# Patient Record
Sex: Female | Born: 1960 | Race: White | Hispanic: No | Marital: Single | Smoking: Current every day smoker
Health system: Southern US, Community
[De-identification: ages and names within clinical notes are randomized; demographics above are authoritative.]

## PROBLEM LIST (undated history)

## (undated) HISTORY — PX: OTHER SURGICAL HISTORY: SHX169

---

## 2001-04-09 ENCOUNTER — Emergency Department (HOSPITAL_COMMUNITY): Admission: EM | Admit: 2001-04-09 | Discharge: 2001-04-09 | Payer: Self-pay | Admitting: *Deleted

## 2001-09-08 ENCOUNTER — Emergency Department (HOSPITAL_COMMUNITY): Admission: EM | Admit: 2001-09-08 | Discharge: 2001-09-08 | Payer: Self-pay | Admitting: Emergency Medicine

## 2002-01-18 ENCOUNTER — Emergency Department (HOSPITAL_COMMUNITY): Admission: EM | Admit: 2002-01-18 | Discharge: 2002-01-18 | Payer: Self-pay | Admitting: Emergency Medicine

## 2002-01-26 ENCOUNTER — Emergency Department (HOSPITAL_COMMUNITY): Admission: EM | Admit: 2002-01-26 | Discharge: 2002-01-26 | Payer: Self-pay | Admitting: Emergency Medicine

## 2002-07-15 ENCOUNTER — Emergency Department (HOSPITAL_COMMUNITY): Admission: EM | Admit: 2002-07-15 | Discharge: 2002-07-15 | Payer: Self-pay | Admitting: Emergency Medicine

## 2007-06-10 ENCOUNTER — Emergency Department (HOSPITAL_COMMUNITY): Admission: EM | Admit: 2007-06-10 | Discharge: 2007-06-10 | Payer: Self-pay | Admitting: Family Medicine

## 2007-11-12 ENCOUNTER — Emergency Department (HOSPITAL_COMMUNITY): Admission: EM | Admit: 2007-11-12 | Discharge: 2007-11-12 | Payer: Self-pay | Admitting: Family Medicine

## 2008-08-14 ENCOUNTER — Emergency Department (HOSPITAL_COMMUNITY): Admission: EM | Admit: 2008-08-14 | Discharge: 2008-08-14 | Payer: Self-pay | Admitting: Emergency Medicine

## 2009-06-12 ENCOUNTER — Emergency Department (HOSPITAL_COMMUNITY): Admission: EM | Admit: 2009-06-12 | Discharge: 2009-06-12 | Payer: Self-pay | Admitting: Emergency Medicine

## 2010-08-17 LAB — RHEUMATOID FACTOR: Rheumatoid fact SerPl-aCnc: <20 IU/mL (ref 0–20)

## 2010-08-17 LAB — ANTISTREPTOLYSIN O TITER: ASO: 32 IU/mL (ref 0–116)

## 2010-08-17 LAB — SEDIMENTATION RATE: Sed Rate: 3 mm/hr (ref 0–22)

## 2012-05-14 ENCOUNTER — Emergency Department (HOSPITAL_COMMUNITY): Payer: Worker's Compensation

## 2012-05-14 ENCOUNTER — Emergency Department (HOSPITAL_COMMUNITY)
Admission: EM | Admit: 2012-05-14 | Discharge: 2012-05-14 | Disposition: A | Discharge status: Home / Self Care | Payer: Worker's Compensation | Attending: Emergency Medicine | Admitting: Emergency Medicine

## 2012-05-14 ENCOUNTER — Encounter (HOSPITAL_COMMUNITY): Payer: Self-pay | Admitting: Emergency Medicine

## 2012-05-14 DIAGNOSIS — S0003XA Contusion of scalp, initial encounter: Secondary | ICD-10-CM | POA: Insufficient documentation

## 2012-05-14 DIAGNOSIS — F17200 Nicotine dependence, unspecified, uncomplicated: Secondary | ICD-10-CM | POA: Insufficient documentation

## 2012-05-14 DIAGNOSIS — IMO0002 Reserved for concepts with insufficient information to code with codable children: Secondary | ICD-10-CM | POA: Insufficient documentation

## 2012-05-14 DIAGNOSIS — S50311A Abrasion of right elbow, initial encounter: Secondary | ICD-10-CM

## 2012-05-14 MED ORDER — IBUPROFEN 600 MG PO TABS: 600.0000 mg | ORAL_TABLET | Freq: Four times a day (QID) | ORAL | Status: DC | PRN

## 2012-05-14 MED ORDER — OXYCODONE-ACETAMINOPHEN 5-325 MG PO TABS
1.0000 | ORAL_TABLET | Freq: Once | ORAL | Status: AC
  Administered 2012-05-14: 1 via ORAL
  Filled 2012-05-14: qty 1
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
## 2012-05-14 NOTE — ED Notes (Signed)
The pt is asking  Again when she will be seen.
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
## 2012-05-14 NOTE — ED Notes (Signed)
Patient transported to X-ray and CT 

## 2012-05-14 NOTE — ED Provider Notes (Signed)
Medical screening examination/treatment/procedure(s) were performed by non-physician practitioner and as supervising physician I was immediately available for consultation/collaboration.  Gerhard Munch, MD 05/14/12 2355

## 2012-05-14 NOTE — ED Notes (Signed)
Pt requesting pain med.   Triage rn   notified

## 2012-07-03 ENCOUNTER — Emergency Department (INDEPENDENT_AMBULATORY_CARE_PROVIDER_SITE_OTHER)
Admission: EM | Admit: 2012-07-03 | Discharge: 2012-07-03 | Disposition: A | Discharge status: Home / Self Care | Payer: Self-pay | Source: Home / Self Care | Attending: Family Medicine | Admitting: Family Medicine

## 2012-07-03 ENCOUNTER — Encounter (HOSPITAL_COMMUNITY): Payer: Self-pay | Admitting: Emergency Medicine

## 2012-07-03 DIAGNOSIS — K047 Periapical abscess without sinus: Secondary | ICD-10-CM

## 2012-07-03 MED ORDER — AMOXICILLIN 500 MG PO CAPS: 500.0000 mg | ORAL_CAPSULE | Freq: Three times a day (TID) | ORAL | Status: DC

## 2012-07-03 MED ORDER — DICLOFENAC POTASSIUM 50 MG PO TABS: 50.0000 mg | ORAL_TABLET | Freq: Three times a day (TID) | ORAL | Status: DC

## 2012-07-03 NOTE — ED Notes (Signed)
Pt c/o dental pain x2 days Sx include: swelling, pain Denies: f/v/n/d Had ibuprofen today around 1430  She is alert w/no signs of acute distress.

## 2012-07-03 NOTE — ED Provider Notes (Signed)
History     CSN: [id number]  Arrival date & time 07/03/12  1758   First MD Initiated Contact with Patient 07/03/12 1811      Chief Complaint  Patient presents with  . Dental Pain    (Consider location/radiation/quality/duration/timing/severity/associated sxs/prior treatment) Patient is a 52 y.o. female presenting with tooth pain. The history is provided by the patient.  Dental PainThe primary symptoms include mouth pain and oral lesions. The symptoms began 2 days ago. The symptoms are worsening.  Additional symptoms include: gum tenderness, purulent gums and jaw pain.    History reviewed. No pertinent past medical history.  History reviewed. No pertinent past surgical history.  No family history on file.  History  Substance Use Topics  . Smoking status: Current Every Day Smoker  . Smokeless tobacco: Not on file  . Alcohol Use: Yes    OB History   Grav Para Term Preterm Abortions TAB SAB Ect Mult Living                  Review of Systems  Constitutional: Negative.   HENT: Positive for dental problem.     Allergies  Review of patient's allergies indicates no known allergies.  Home Medications   Current Outpatient Rx  Name  Route  Sig  Dispense  Refill  . ibuprofen (ADVIL,MOTRIN) 600 MG tablet   Oral   Take 1 tablet (600 mg total) by mouth every 6 (six) hours as needed for pain.   30 tablet   0   . amoxicillin (AMOXIL) 500 MG capsule   Oral   Take 1 capsule (500 mg total) by mouth 3 (three) times daily.   30 capsule   0   . diclofenac (CATAFLAM) 50 MG tablet   Oral   Take 1 tablet (50 mg total) by mouth 3 (three) times daily.   21 tablet   0     BP 169/95  Pulse 71  Temp(Src) 99.1 F (37.3 C) (Oral)  Resp 18  SpO2 99%  Physical Exam  Nursing note and vitals reviewed. Constitutional: She appears well-developed and well-nourished.  HENT:  Right Ear: External ear normal.  Left Ear: External ear normal.  Mouth/Throat: Uvula is midline and  oropharynx is clear and moist. Abnormal dentition. Dental abscesses and dental caries present.      ED Course  Procedures (including critical care time)  Labs Reviewed - No data to display No results found.   1. Dental abscess       MDM          Linna Hoff, MD 07/03/12 1911

## 2012-08-04 ENCOUNTER — Emergency Department (HOSPITAL_COMMUNITY)
Admission: EM | Admit: 2012-08-04 | Discharge: 2012-08-04 | Disposition: A | Discharge status: Home / Self Care | Payer: Self-pay | Attending: Emergency Medicine | Admitting: Emergency Medicine

## 2012-08-04 ENCOUNTER — Encounter (HOSPITAL_COMMUNITY): Payer: Self-pay | Admitting: *Deleted

## 2012-08-04 DIAGNOSIS — K047 Periapical abscess without sinus: Secondary | ICD-10-CM | POA: Insufficient documentation

## 2012-08-04 DIAGNOSIS — K029 Dental caries, unspecified: Secondary | ICD-10-CM | POA: Insufficient documentation

## 2012-08-04 DIAGNOSIS — K089 Disorder of teeth and supporting structures, unspecified: Secondary | ICD-10-CM | POA: Insufficient documentation

## 2012-08-04 DIAGNOSIS — F17200 Nicotine dependence, unspecified, uncomplicated: Secondary | ICD-10-CM | POA: Insufficient documentation

## 2012-08-04 DIAGNOSIS — K0889 Other specified disorders of teeth and supporting structures: Secondary | ICD-10-CM

## 2012-08-04 MED ORDER — METRONIDAZOLE 500 MG PO TABS
500.0000 mg | ORAL_TABLET | Freq: Once | ORAL | Status: AC
  Administered 2012-08-04: 500 mg via ORAL
  Filled 2012-08-04: qty 1

## 2012-08-04 MED ORDER — OXYCODONE-ACETAMINOPHEN 5-325 MG PO TABS
1.0000 | ORAL_TABLET | Freq: Once | ORAL | Status: AC
  Administered 2012-08-04: 1 via ORAL
  Filled 2012-08-04: qty 1

## 2012-08-04 MED ORDER — PENICILLIN V POTASSIUM 500 MG PO TABS: 500.0000 mg | ORAL_TABLET | Freq: Four times a day (QID) | ORAL | Status: DC

## 2012-08-04 MED ORDER — PENICILLIN V POTASSIUM 250 MG PO TABS
500.0000 mg | ORAL_TABLET | Freq: Once | ORAL | Status: AC
  Administered 2012-08-04: 500 mg via ORAL
  Filled 2012-08-04: qty 2

## 2012-08-04 MED ORDER — OXYCODONE-ACETAMINOPHEN 5-325 MG PO TABS: ORAL_TABLET | ORAL | Status: DC

## 2012-08-04 MED ORDER — METRONIDAZOLE 500 MG PO TABS: 500.0000 mg | ORAL_TABLET | Freq: Two times a day (BID) | ORAL | Status: DC

## 2012-08-04 NOTE — ED Provider Notes (Signed)
History     CSN: [id number]  Arrival date & time 08/04/12  0918   First MD Initiated Contact with Patient 08/04/12 0945      Chief Complaint  Patient presents with  . Dental Pain    (Consider location/radiation/quality/duration/timing/severity/associated sxs/prior treatment) HPI  Laurie Marks is a 52 y.o. female complaining of right lower tooth pain exacerbation and swelling onset last night. Patient denies difficulty swallowing her secretions, fever, vomiting. Pain is rated as severe, 8/10 she is taken Motrin 800 mg with little relief. Pain is exacerbated by chewing. Patient has an appointment with a dentist for extraction of the affected tooth on Wednesday.   History reviewed. No pertinent past medical history.  Past Surgical History  Procedure Laterality Date  . Tracheotomy      as child    No family history on file.  History  Substance Use Topics  . Smoking status: Current Every Day Smoker  . Smokeless tobacco: Not on file  . Alcohol Use: Yes    OB History   Grav Para Term Preterm Abortions TAB SAB Ect Mult Living                  Review of Systems  Constitutional: Negative for fever.  HENT: Positive for dental problem. Negative for ear pain, facial swelling, drooling, trouble swallowing, neck pain, neck stiffness and voice change.   Respiratory: Negative for shortness of breath.   Cardiovascular: Negative for chest pain.  Gastrointestinal: Negative for nausea, vomiting, abdominal pain and diarrhea.  All other systems reviewed and are negative.    Allergies  Review of patient's allergies indicates no known allergies.  Home Medications   Current Outpatient Rx  Name  Route  Sig  Dispense  Refill  . ibuprofen (ADVIL,MOTRIN) 200 MG tablet   Oral   Take 800 mg by mouth every 6 (six) hours as needed for pain.           BP 144/83  Pulse 73  Temp(Src) 98.5 F (36.9 C) (Oral)  Resp 18  SpO2 98%  Physical Exam  Nursing note and vitals  reviewed. Constitutional: She is oriented to person, place, and time. She appears well-developed and well-nourished. No distress.  HENT:  Head: Normocephalic.  Mouth/Throat: Oropharynx is clear and moist.    Mild swelling to right jaw area with no erythema or induration externally.  Extensive significantly eroded dental caries. 2:30 actively painful with lateral oral mucosa swelling, erythema and tenderness.  No firmness or tenderness to palpation under,  Eyes: Conjunctivae and EOM are normal.  Cardiovascular: Normal rate.   Pulmonary/Chest: Effort normal. No stridor.  Musculoskeletal: Normal range of motion.  Lymphadenopathy:    She has cervical adenopathy.  Neurological: She is alert and oriented to person, place, and time.  Psychiatric: She has a normal mood and affect.    ED Course  Procedures (including critical care time)  Labs Reviewed - No data to display No results found.  Medications  oxyCODONE-acetaminophen (PERCOCET/ROXICET) 5-325 MG per tablet 1 tablet (not administered)  penicillin v potassium (VEETID) tablet 500 mg (not administered)  metroNIDAZOLE (FLAGYL) tablet 500 mg (not administered)    1. Pain, dental   2. Dental abscess   3. Tobacco use disorder       MDM    Laurie Marks is a 52 y.o. female with right lower dental caries and dental abscess.   Filed Vitals:   08/04/12 0923  BP: 144/83  Pulse: 73  Temp: 98.5 F (  36.9 C)  TempSrc: Oral  Resp: 18  SpO2: 98%     Pt verbalized understanding and agrees with care plan. Outpatient follow-up and return precautions given.    New Prescriptions   METRONIDAZOLE (FLAGYL) 500 MG TABLET    Take 1 tablet (500 mg total) by mouth 2 (two) times daily. One tab PO bid x 10 days   OXYCODONE-ACETAMINOPHEN (PERCOCET/ROXICET) 5-325 MG PER TABLET    1 to 2 tabs PO q6hrs  PRN for pain   PENICILLIN V POTASSIUM (VEETID) 500 MG TABLET    Take 1 tablet (500 mg total) by mouth 4 (four) times daily.            Wynetta Emery, PA-C 08/04/12 1030

## 2012-08-04 NOTE — ED Notes (Signed)
Pt is here with right lower jaw swelling and tooth pain.

## 2012-08-05 NOTE — ED Provider Notes (Signed)
Medical screening examination/treatment/procedure(s) were performed by non-physician practitioner and as supervising physician I was immediately available for consultation/collaboration.  Flint Melter, MD 08/05/12 [phone]

## 2012-09-23 ENCOUNTER — Emergency Department (HOSPITAL_COMMUNITY)
Admission: EM | Admit: 2012-09-23 | Discharge: 2012-09-23 | Disposition: A | Discharge status: Home / Self Care | Payer: Self-pay | Attending: Emergency Medicine | Admitting: Emergency Medicine

## 2012-09-23 ENCOUNTER — Encounter (HOSPITAL_COMMUNITY): Payer: Self-pay | Admitting: Emergency Medicine

## 2012-09-23 DIAGNOSIS — F17200 Nicotine dependence, unspecified, uncomplicated: Secondary | ICD-10-CM | POA: Insufficient documentation

## 2012-09-23 DIAGNOSIS — K0889 Other specified disorders of teeth and supporting structures: Secondary | ICD-10-CM

## 2012-09-23 DIAGNOSIS — K089 Disorder of teeth and supporting structures, unspecified: Secondary | ICD-10-CM | POA: Insufficient documentation

## 2012-09-23 MED ORDER — CLINDAMYCIN HCL 150 MG PO CAPS: 300.0000 mg | ORAL_CAPSULE | Freq: Three times a day (TID) | ORAL | Status: AC

## 2012-09-23 MED ORDER — HYDROCODONE-ACETAMINOPHEN 5-325 MG PO TABS
2.0000 | ORAL_TABLET | Freq: Four times a day (QID) | ORAL | Status: AC | PRN
Start: 2012-09-23 — End: ?

## 2012-09-23 NOTE — ED Provider Notes (Signed)
History     CSN: [id number]  Arrival date & time 09/23/12  4098   First MD Initiated Contact with Patient 09/23/12 0900      No chief complaint on file.   (Consider location/radiation/quality/duration/timing/severity/associated sxs/prior treatment) HPI Comments: Patient presents to the emergency department with a dental complaint. Symptoms began one month ago. The patient has tried to alleviate pain with nothing.  Pain rated at a 10/10, characterized as throbbing in nature and located bottom right jaw. Patient denies fever, night sweats, chills, difficulty swallowing or opening mouth, SOB, nuchal rigidity or decreased ROM of neck.  Patient does not have a dentist and requests a resource guide at discharge.   The history is provided by the patient. No language interpreter was used.    History reviewed. No pertinent past medical history.  Past Surgical History  Procedure Laterality Date  . Tracheotomy      as child    No family history on file.  History  Substance Use Topics  . Smoking status: Current Every Day Smoker  . Smokeless tobacco: Not on file  . Alcohol Use: Yes    OB History   Grav Para Term Preterm Abortions TAB SAB Ect Mult Living                  Review of Systems  All other systems reviewed and are negative.    Allergies  Review of patient's allergies indicates no known allergies.  Home Medications   Current Outpatient Rx  Name  Route  Sig  Dispense  Refill  . ibuprofen (ADVIL,MOTRIN) 200 MG tablet   Oral   Take 800 mg by mouth every 6 (six) hours as needed for pain.         . clindamycin (CLEOCIN) 150 MG capsule   Oral   Take 2 capsules (300 mg total) by mouth 3 (three) times daily. May dispense as 150mg  capsules   60 capsule   0   . HYDROcodone-acetaminophen (NORCO/VICODIN) 5-325 MG per tablet   Oral   Take 2 tablets by mouth every 6 (six) hours as needed for pain.   13 tablet   0     BP 176/90  Pulse 81  Temp(Src) 98.7 F  (37.1 C) (Oral)  SpO2 98%  Physical Exam  Nursing note and vitals reviewed. Constitutional: She is oriented to person, place, and time. She appears well-developed and well-nourished.  HENT:  Head: Normocephalic and atraumatic.  Mouth/Throat:    Poor dentition throughout.  Affected tooth as diagrammed.  No signs of peritonsillar or tonsillar abscess.  No signs of gingival abscess. Oropharynx is clear and without exudates.  Uvula is midline.  Airway is intact. No signs of Ludwig's angina.   Eyes: Conjunctivae and EOM are normal.  Neck: Normal range of motion.  Cardiovascular: Normal rate.   Pulmonary/Chest: Effort normal.  Abdominal: She exhibits no distension.  Musculoskeletal: Normal range of motion.  Neurological: She is alert and oriented to person, place, and time.  Skin: Skin is dry.  Psychiatric: She has a normal mood and affect. Her behavior is normal. Judgment and thought content normal.    ED Course  Procedures (including critical care time)  Labs Reviewed - No data to display No results found.   1. Pain, dental       MDM  Patient with uncomplicated dental pain. Will treat with antibiotics and pain medicine. Will refer to a dentist. Patient is stable and ready for discharge.  Roxy Horseman, PA-C 09/23/12 1551

## 2012-09-23 NOTE — ED Notes (Signed)
Here a month ago with tooth pain. Had appointment to see dentist but tooth was still infected so he couldn't pull it.

## 2012-09-24 NOTE — ED Provider Notes (Signed)
Medical screening examination/treatment/procedure(s) were performed by non-physician practitioner and as supervising physician I was immediately available for consultation/collaboration.  Maytte Jacot, MD 09/24/12 0749 

## 2014-03-29 ENCOUNTER — Emergency Department (HOSPITAL_COMMUNITY)
Admission: EM | Admit: 2014-03-29 | Discharge: 2014-03-29 | Disposition: A | Discharge status: Home / Self Care | Payer: Self-pay | Attending: Emergency Medicine | Admitting: Emergency Medicine

## 2014-03-29 ENCOUNTER — Emergency Department (HOSPITAL_COMMUNITY): Payer: Self-pay

## 2014-03-29 ENCOUNTER — Encounter (HOSPITAL_COMMUNITY): Payer: Self-pay | Admitting: Family Medicine

## 2014-03-29 DIAGNOSIS — M87051 Idiopathic aseptic necrosis of right femur: Secondary | ICD-10-CM

## 2014-03-29 DIAGNOSIS — Y990 Civilian activity done for income or pay: Secondary | ICD-10-CM | POA: Insufficient documentation

## 2014-03-29 DIAGNOSIS — S8991XA Unspecified injury of right lower leg, initial encounter: Secondary | ICD-10-CM | POA: Insufficient documentation

## 2014-03-29 DIAGNOSIS — M25561 Pain in right knee: Secondary | ICD-10-CM

## 2014-03-29 DIAGNOSIS — Y9289 Other specified places as the place of occurrence of the external cause: Secondary | ICD-10-CM | POA: Insufficient documentation

## 2014-03-29 DIAGNOSIS — W228XXA Striking against or struck by other objects, initial encounter: Secondary | ICD-10-CM | POA: Insufficient documentation

## 2014-03-29 DIAGNOSIS — Y9389 Activity, other specified: Secondary | ICD-10-CM | POA: Insufficient documentation

## 2014-03-29 DIAGNOSIS — Z720 Tobacco use: Secondary | ICD-10-CM | POA: Insufficient documentation

## 2014-03-29 DIAGNOSIS — M86151 Other acute osteomyelitis, right femur: Secondary | ICD-10-CM | POA: Insufficient documentation

## 2014-03-29 DIAGNOSIS — Z792 Long term (current) use of antibiotics: Secondary | ICD-10-CM | POA: Insufficient documentation

## 2014-03-29 MED ORDER — KETOROLAC TROMETHAMINE 60 MG/2ML IM SOLN
60.0000 mg | Freq: Once | INTRAMUSCULAR | Status: AC
  Administered 2014-03-29: 60 mg via INTRAMUSCULAR
  Filled 2014-03-29: qty 2

## 2014-03-29 NOTE — ED Notes (Signed)
Pt states she began having R knee while at work. States sharp pains, increases with movement. Reports increased swelling and pain over the past several days. No obvious deformities noted. NAD.

## 2014-03-29 NOTE — Discharge Instructions (Signed)
Return to the emergency room with worsening of symptoms, new symptoms or with symptoms that are concerning, especially fevers, redness, swelling, warmth to knee. RICE: Rest, Ice (three cycles of 20 mins on, 20mins off at least twice a day), compression/brace, elevation. Heating pad works well for back pain. Ibuprofen 400mg  (2 tablets 200mg ) every 5-6 hours for 3-5 days and then as needed for pain. Follow up with orthopedist for incidental findings of bone infarct on xray in right distal femur and right proximal tibia [facility] for physical and general check up.  Please call your doctor for a followup appointment within 24-48 hours. When you talk to your doctor please let them know that you were seen in the emergency department and have them acquire all of your records so that they can discuss the findings with you and formulate a treatment plan to fully care for your new and ongoing problems. If you do not have a primary care provider please call the number below under ED resources to establish care with a provider and follow up.    Emergency Department Resource Guide 1) Find a Doctor and Pay Out of Pocket Although you won't have to find out who is covered by your insurance plan, it is a good idea to ask around and get recommendations. You will then need to call the office and see if the doctor you have chosen will accept you as a new patient and what types of options they offer for patients who are self-pay. Some doctors offer discounts or will set up payment plans for their patients who do not have insurance, but you will need to ask so you aren't surprised when you get to your appointment.  2) Contact Your Local Health Department Not all health departments have doctors that can see patients for sick visits, but many do, so it is worth a call to see if yours does. If you don't know where your local health department is, you can check in your phone book. The CDC also  has a tool to help you locate your state's health department, and many state websites also have listings of all of their local health departments.  3) Find a Walk-in Clinic If your illness is not likely to be very severe or complicated, you may want to try a walk in clinic. These are popping up all over the country in pharmacies, drugstores, and shopping centers. They're usually staffed by nurse practitioners or physician assistants that have been trained to treat common illnesses and complaints. They're usually fairly quick and inexpensive. However, if you have serious medical issues or chronic medical problems, these are probably not your best option.  No Primary Care Doctor: - Call Health Connect at  [phone] - they can help you locate a primary care doctor that  accepts your insurance, provides certain services, etc. - Physician Referral Service- [phone]  Chronic Pain Problems: Organization         Address  Phone   Notes  [facility]  [phone] Patients need to be referred by their primary care doctor.   Medication Assistance: Organization         Address  Phone   Notes  [facility] Medication [facility] [address] [place], [place] [address] [phone] --Must be a resident of [facility] [place] -- Must have NO insurance coverage whatsoever (no Medicaid/ Medicare, etc.) -- The pt. MUST have a primary care doctor that directs their care  regularly and follows them in the community   MedAssist  [phone]   [employer]  [phone]    Agencies that provide inexpensive medical care: Organization         Address  Phone   Notes  [facility]  [phone]) [phone]   [facility]    [phone]   [facility] [address] [place], [place] [address] [phone]   [facility] [address], [place] [phone]) [phone]   Planned Parenthood    [phone]    [facility]    [phone](336) [phone]   [facility]  [address], [place] Phone:  [phone](336) [phone], Fax:  [phone] Hours of Operation:  9 am - 6 pm, M-F.  Also accepts Medicaid/Medicare and self-pay.  [facility]  [address], [place] Phone: [phone], Fax: [phone]. Hours of Operation:  8:30 am - 5:30 pm, M-F.  Also accepts Medicaid and self-pay.  [facility] [place] [address], [place] Phone: [phone](336) [phone]   [facility] [address], [place], [place] [phone], Ext. [phone] Mondays & Thursdays: 7-9 AM.  First 15 patients are seen on a first come, first serve basis.    Medicaid-accepting [facility] Providers:  Organization         Address  Phone   Notes  [facility] [address], [place] [phone] Also accepts self-pay patients.  [facility] [address], [place]  [phone]   [facility] [address], [place] [phone]   [facility] [address] [place] [address], [place] [phone]) [phone]   Renaye RakersVeita Bland [address], [place]   [phone] Only accepts [place] Access [place] patients after they have their name applied to their card.   Self-Pay (no insurance) in [facility]:  Organization         Address  Phone   Notes  Sickle Cell Patients, [facility] [address], [place] [phone]) [phone]   [facility] [address], [place] [phone]) [phone]   [facility] [place]  [address], [place] [phone]) [phone]   [facility]/Dr. Osei-Bonsu  [address] [place] [address], [place] or [phone], [place] [phone] Phone number for both [place] and [place] locations is the same.  [facility] [address], [place] [phone]   [facility] [address] [place] [address], [place] or [address] [phone]) [phone]   [facility] [address], [place] [phone]) [phone], phone; [phone], fax Sees patients 1st and 3rd Saturday of every month.  Must not qualify for public or private insurance (i.e. Medicaid, Medicare,  Health Choice, Veterans' Benefits)  Household income should be no more than 200% of the poverty level The clinic cannot treat you if you are pregnant or think you are pregnant  Sexually transmitted diseases are not treated at the clinic.    Dental Care: Organization         Address  Phone  Notes  [facility] Department of [facility] [address], [place] [phone] Accepts children up to age 52  who are enrolled in Medicaid or [place] Health Choice; pregnant women with a Medicaid card; and children who have applied for Medicaid or [place] Health Choice, but were declined, whose parents can pay a reduced fee at time of service.  [facility] of [facility]  [address], [place] [phone] Accepts children up to age 10 who are enrolled in [place] or [place] Health Choice; pregnant women with a Medicaid card; and children who have applied for Medicaid or [place] Health Choice, but were declined, whose parents can pay a reduced fee at time of service.  [facility]  [address] [place], [place] [phone] Patients are seen by appointment only. Walk-ins are not accepted. [facility] will see patients 34 years of age and older. Monday - Tuesday (8am-5pm) Most Wednesdays (8:30-5pm) $30 per visit, cash only  [facility]  [address], [facility] [phone] Patients are seen by appointment only. Walk-ins are not accepted. [facility] will see patients 65 years of age and older. One Wednesday  Evening (Monthly: Volunteer Based).  $30 per visit, cash only  [employer]  [phone] for adults; Children under age 54, call Graduate Pediatric Dentistry at [phone]. Children aged 42-14, please call [phone] to request a pediatric application.  Dental services are provided in all areas of dental care including fillings, crowns and bridges, complete and partial dentures, implants, gum treatment, root canals, and extractions. Preventive care is also provided. Treatment is provided to both adults and children. Patients are selected via a lottery and there is often a waiting list.   [facility] [address], [place]  [phone] [email or web address]   [facility] [address] [place], [place] [phone], Ext. [phone] Second and Fourth Thursday of each month, opens at 6:30 AM; Clinic ends at 9 AM.  Patients are seen on a first-come first-served basis, and a limited number are seen during each clinic.   [facility]  [address] [place], [place] [phone]   Eligibility Requirements You must have lived in [place], [place], or [place] counties for at least the last three months.   You cannot be eligible for state or federal sponsored [place], including [employer], [place], or [employer].   You generally cannot be eligible for healthcare insurance through your employer.    How to apply: Eligibility screenings are held every Tuesday and Wednesday afternoon from 1:00 pm until 4:00 pm. You do not need an appointment for the interview!  [facility] [address] [place], [place] [phone]   [facility] Health Department  [phone]   [facility] Health Department  [phone]   [facility] Health Department  [phone]    Behavioral Health Resources in the Community: Intensive Outpatient Programs Organization         Address  Phone  Notes  [facility] [address] [place], [place] [phone]   [facility] [address], [place], [place] [phone]   [facility] [address] [place], [place]  [phone]   [facility] [address],  [place], [place] [phone] or [phone]   Substance Abuse Resources Organization         Address  Phone  Notes  Alcohol [facility] Drug Services  [phone]   [facility]  [phone]   [facility]  [phone]   Floydene Flock  [phone]   Residential & Outpatient Substance Abuse Program  [phone]   Psychological Services Organization         Address  Phone  Notes  [facility]al Health  [phone]   [facility]  [phone]   [facility] Mental Health [address], [place] [phone] or [phone]    Mobile Crisis Teams Organization         Address  Phone  Notes  [facility], Mobile Crisis Care Unit  [phone]   [facility]  [address] [place], [place] [phone]   Doristine Locks [address] [place] [phone]    Self-Help/Support Groups Organization         Address  Phone             Notes  [facility] [place] - variety of support groups  [phone]- [phone] Call for more information  Narcotics Anonymous (NA), Caring Services [address], [employer] [place]  2 meetings at this location   Statistician         Address  Phone  Notes  [facility] [address],    [place]  [phone]   [facility]  [address] [place] [address], [place], [place] [phone]   [facility] [address] [place], [place] [phone] Admissions: 8am-3pm M-F  [facility] [address],    [place], [place] [phone]   [facility] [address] [place],  [place], [place] [phone]   [facility] [address],  [place], [place] [phone]   [facility] - Intensive Outpatient [address], [place], [place] [phone]   [facility] ([facility] [address]  [place], [place] [phone] or [phone]   [facility] (RTS) [address], [place], [place] [phone] Accepts Medicaid  [facility] [address],  [place] [phone] Substance Abuse/Addiction Treatment   [facility] Organization         Address  Phone  Notes  [facility]  [phone]   Angie Fava, PhD [address] [place] [place], [place]   [phone] or [phone]   [facility] Behavioral   [address] [place], [place] [phone]   [facility] [address], [place], [place] [phone] Insurance/Medicaid/sponsorship through [facility] Families [address]                                    [place], [place] [phone] Therapy/tele-psych/case  [facility] [address], [place] [phone]    Dr. Lolly Mustache  [phone]   [facility]  [employer] [facility] Dept. 1) [address] [place] 2) [address], [place] 3)  [address], [place] [phone]  [phone]   [facility] Child Abuse Hotline [phone]) 051-3560 or [phone])650-9877 (After Hours)

## 2014-03-29 NOTE — ED Notes (Signed)
Per pt sts right knee pain over the past few days. sts was at work walking when it started. Pt has swelling to right knee.

## 2014-03-29 NOTE — ED Provider Notes (Signed)
CSN: [id number]     Arrival date & time 03/29/14  1138 History  This chart was scribed for a non-physician practitioner, Louann SjogrenVictoria L Ernie Kasler, PA-C working with Juliet RudeNathan R. Rubin PayorPickering, MD by [place] Peace, ED Scribe. The patient was seen in TR11C/TR11C. The patient's care was started at 1:29 PM.     Chief Complaint  Patient presents with  . Knee Pain      Patient is a 53 y.o. female presenting with knee pain. The history is provided by the patient. No language interpreter was used.  Knee Pain   HPI Comments: Laurie Marks is a 53 y.o. female who presents to the Emergency Department complaining of right knee pain onset a few days ago with associated swelling. Pt states that she works at [employer]. She adds that she hit her knee on the ladder while at work. Pt states that after sitting down for a little while, it is very difficult for her to stand back up and start walking. She notes that while she was walking, she began experiencing a really sharp,stinging pain in her knee. No complaints of numbness or tingling. No weakness. Pt notes taking Ibuprofen and Aleve without much relief.  No history of DM, heart problems, or sickle cell. Pt does not have PCP. Pt is current everyday smoker.    History reviewed. No pertinent past medical history. Past Surgical History  Procedure Laterality Date  . Tracheotomy      as child   History reviewed. No pertinent family history. History  Substance Use Topics  . Smoking status: Current Every Day Smoker  . Smokeless tobacco: Not on file  . Alcohol Use: Yes   OB History    No data available     Review of Systems  Musculoskeletal: Positive for arthralgias.       Right knee pain.   Neurological: Negative for weakness and numbness.  All other systems reviewed and are negative.     Allergies  Review of patient's allergies indicates no known allergies.  Home Medications   Prior to Admission medications   Medication Sig Start Date End Date Taking?  Authorizing Provider  clindamycin (CLEOCIN) 150 MG capsule Take 2 capsules (300 mg total) by mouth 3 (three) times daily. May dispense as 150mg  capsules 09/23/12   Roxy Horsemanobert Browning, PA-C  HYDROcodone-acetaminophen (NORCO/VICODIN) 5-325 MG per tablet Take 2 tablets by mouth every 6 (six) hours as needed for pain. 09/23/12   Roxy Horsemanobert Browning, PA-C  ibuprofen (ADVIL,MOTRIN) 200 MG tablet Take 800 mg by mouth every 6 (six) hours as needed for pain.    Historical Provider, MD   BP 174/82 mmHg  Pulse 87  Temp(Src) 98.2 F (36.8 C)  Resp 16  Ht 5\' 3"  (1.6 m)  Wt 170 lb (77.111 kg)  BMI 30.12 kg/m2  SpO2 97% Physical Exam  Constitutional: She appears well-developed and well-nourished. No distress.  HENT:  Head: Normocephalic and atraumatic.  Eyes: Conjunctivae are normal. Right eye exhibits no discharge. Left eye exhibits no discharge.  Pulmonary/Chest: Effort normal. No respiratory distress.  Musculoskeletal: She exhibits tenderness.  Right knee: No effusion, erythema or warmth. No tenderness over medial or lateral joint line. No ligamentous laxity noted. Negative anterior and posterior drawer test. Patient with normal gait. No tenderness to right lower leg or thigh. No lesions, ecchymoses or wounds to right leg.   Neurological: She is alert. Coordination normal.  Skin: She is not diaphoretic.  Psychiatric: She has a normal mood and affect. Her behavior is normal.  Nursing note and vitals reviewed.   ED Course  Procedures (including critical care time) Labs Review Labs Reviewed - No data to display  Imaging Review Dg Knee Complete 4 Views Right  03/29/2014   CLINICAL DATA:  Hit knee on ladder. Knee pain and swelling of right knee.  EXAM: RIGHT KNEE - COMPLETE 4+ VIEW  COMPARISON:  None.  FINDINGS: There are curvilinear sclerotic areas within the distal femur and proximal tibia most compatible with bone infarcts. No acute bony abnormality. No fracture, subluxation or dislocation. Possible  trace joint effusion. Soft tissues are intact.  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 No acute bony abnormality.   Electronically Signed   By: Charlett NoseKevin  Dover M.D.   On: 03/29/2014 13:18     EKG Interpretation None     Medications  ketorolac (TORADOL) injection 60 mg (60 mg Intramuscular Given 03/29/14 1403)    1:35 PM- Treatment plan was discussed with patient who verbalizes understanding and agrees.   MDM   Final diagnoses:  Bone infarct of distal femur, right  Knee pain, acute, right   Patient X-Ray negative for obvious fracture or dislocation. Pt with incidental finding of bone infarct in distal femur and proximal tibia. Pt denies history of heart problems or arrhythmias. No murmur on exam. Pt to follow up with ortho for further evaluation and management. Knee Pain managed in ED.  Patient given brace while in ED, conservative therapy recommended and discussed. Patient will be dc home & is agreeable with above plan.  Discussed return precautions with patient. Discussed all results and patient verbalizes understanding and agrees with plan.  Case has been discussed with Dr. Rubin PayorPickering who agrees with the above plan and to discharge.   I personally performed the services described in this documentation, which was scribed in my presence. The recorded information has been reviewed and is accurate.   Louann SjogrenVictoria L Sylena Lotter, PA-C 03/29/14 1948  Juliet RudeNathan R. Rubin PayorPickering, MD 04/06/14 [phone]

## 2014-03-29 NOTE — ED Notes (Signed)
Declined W/C at D/C and was escorted to lobby by RN. 

## 2014-12-24 ENCOUNTER — Emergency Department (HOSPITAL_COMMUNITY)
Admission: EM | Admit: 2014-12-24 | Discharge: 2014-12-24 | Disposition: A | Discharge status: Home / Self Care | Payer: BLUE CROSS/BLUE SHIELD | Attending: Emergency Medicine | Admitting: Emergency Medicine

## 2014-12-24 ENCOUNTER — Encounter (HOSPITAL_COMMUNITY): Payer: Self-pay | Admitting: Emergency Medicine

## 2014-12-24 DIAGNOSIS — Y9389 Activity, other specified: Secondary | ICD-10-CM | POA: Insufficient documentation

## 2014-12-24 DIAGNOSIS — Y9289 Other specified places as the place of occurrence of the external cause: Secondary | ICD-10-CM | POA: Diagnosis not present

## 2014-12-24 DIAGNOSIS — S0592XA Unspecified injury of left eye and orbit, initial encounter: Secondary | ICD-10-CM | POA: Diagnosis present

## 2014-12-24 DIAGNOSIS — Z792 Long term (current) use of antibiotics: Secondary | ICD-10-CM | POA: Insufficient documentation

## 2014-12-24 DIAGNOSIS — X58XXXA Exposure to other specified factors, initial encounter: Secondary | ICD-10-CM | POA: Insufficient documentation

## 2014-12-24 DIAGNOSIS — Z720 Tobacco use: Secondary | ICD-10-CM | POA: Insufficient documentation

## 2014-12-24 DIAGNOSIS — Y998 Other external cause status: Secondary | ICD-10-CM | POA: Diagnosis not present

## 2014-12-24 DIAGNOSIS — S0502XA Injury of conjunctiva and corneal abrasion without foreign body, left eye, initial encounter: Secondary | ICD-10-CM | POA: Diagnosis not present

## 2014-12-24 MED ORDER — KETOROLAC TROMETHAMINE 0.5 % OP SOLN: 1.0000 [drp] | Freq: Four times a day (QID) | OPHTHALMIC | Status: AC | PRN

## 2014-12-24 MED ORDER — FLUORESCEIN SODIUM 1 MG OP STRP
1.0000 | ORAL_STRIP | Freq: Once | OPHTHALMIC | Status: DC
  Filled 2014-12-24: qty 1

## 2014-12-24 MED ORDER — TETRACAINE HCL 0.5 % OP SOLN
2.0000 [drp] | Freq: Once | OPHTHALMIC | Status: DC
  Filled 2014-12-24: qty 2

## 2014-12-24 MED ORDER — POLYMYXIN B-TRIMETHOPRIM 10000-0.1 UNIT/ML-% OP SOLN: 1.0000 [drp] | OPHTHALMIC | Status: AC

## 2014-12-24 NOTE — ED Notes (Signed)
Pt states that she has had drainage in her left eye she states that when she woke up this morning her eye was closed shut with dried drainage

## 2014-12-24 NOTE — Discharge Instructions (Signed)
Read the information below.  Use the prescribed medication as directed.  Please discuss all new medications with your pharmacist.  You may return to the Emergency Department at any time for worsening condition or any new symptoms that concern you.   If you develop worsening pain in your eye, change in your vision, swelling around your eye, difficulty moving your eye, or fevers greater than 100.4, see your eye doctor or return to the Emergency Department immediately for a recheck.      Corneal Abrasion The cornea is the clear covering at the front and center of the eye. When looking at the colored portion of the eye (iris), you are looking through the cornea. This very thin tissue is made up of many layers. The surface layer is a single layer of cells (corneal epithelium) and is one of the most sensitive tissues in the body. If a scratch or injury causes the corneal epithelium to come off, it is called a corneal abrasion. If the injury extends to the tissues below the epithelium, the condition is called a corneal ulcer. CAUSES   Scratches.  Trauma.  Foreign body in the eye. Some people have recurrences of abrasions in the area of the original injury even after it has healed (recurrent erosion syndrome). Recurrent erosion syndrome generally improves and goes away with time. SYMPTOMS   Eye pain.  Difficulty or inability to keep the injured eye open.  The eye becomes very sensitive to light.  Recurrent erosions tend to happen suddenly, first thing in the morning, usually after waking up and opening the eye. DIAGNOSIS  Your health care provider can diagnose a corneal abrasion during an eye exam. Dye is usually placed in the eye using a drop or a small paper strip moistened by your tears. When the eye is examined with a special light, the abrasion shows up clearly because of the dye. TREATMENT   Small abrasions may be treated with antibiotic drops or ointment alone.  A pressure patch may be put  over the eye. If this is done, follow your doctor's instructions for when to remove the patch. Do not drive or use machines while the eye patch is on. Judging distances is hard to do with a patch on. If the abrasion becomes infected and spreads to the deeper tissues of the cornea, a corneal ulcer can result. This is serious because it can cause corneal scarring. Corneal scars interfere with light passing through the cornea and cause a loss of vision in the involved eye. HOME CARE INSTRUCTIONS  Use medicine or ointment as directed. Only take over-the-counter or prescription medicines for pain, discomfort, or fever as directed by your health care provider.  Do not drive or operate machinery if your eye is patched. Your ability to judge distances is impaired.  If your health care provider has given you a follow-up appointment, it is very important to keep that appointment. Not keeping the appointment could result in a severe eye infection or permanent loss of vision. If there is any problem keeping the appointment, let your health care provider know. SEEK MEDICAL CARE IF:   You have pain, light sensitivity, and a scratchy feeling in one eye or both eyes.  Your pressure patch keeps loosening up, and you can blink your eye under the patch after treatment.  Any kind of discharge develops from the eye after treatment or if the lids stick together in the morning.  You have the same symptoms in the morning as you did  with the original abrasion days, weeks, or months after the abrasion healed. MAKE SURE YOU:   Understand these instructions.  Will watch your condition.  Will get help right away if you are not doing well or get worse. Document Released: 04/21/2000 Document Revised: 04/29/2013 Document Reviewed: 12/30/2012 [facility] Patient Information 2015 [place], [place]. This information is not intended to replace advice given to you by your health care provider. Make sure you discuss any questions  you have with your health care provider.

## 2014-12-24 NOTE — ED Provider Notes (Signed)
CSN: [id number]     Arrival date & time 12/24/14  1013 History  This chart was scribed for non-physician practitioner Trixie Dredge, PA-C, working with Raeford Razor, MD by Littie Deeds, ED Scribe. This patient was seen in room TR04C/TR04C and the patient's care was started at 10:50 AM.     Chief Complaint  Patient presents with  . Eye Problem   The history is provided by the patient. No language interpreter was used.   HPI Comments: Laurie Marks is a 54 y.o. female who presents to the Emergency Department complaining of  left eye pain that began yesterday and crusty eye discharge that started after she woke up this morning. Patient notes that something blew into her left eye as she was mowing the yard yesterday and her left eye was irritated all day.  Overnight she developed discharge with crusting over her eyelashes. Patient reports having associated stinging eye pain, watery eye discharge, and photophobia. The pain is described as a 7-8/10 in severity. She feels as if there is some mild swelling below her left eye. She denies fever, chills, ear pain, sore throat, rhinorrhea, and cough. She also denies sick contacts. Patient does wear glasses, but does not wear contacts. She recently saw an eye doctor at [facility], where she works, a few months ago and received new glasses at that time.    History reviewed. No pertinent past medical history. Past Surgical History  Procedure Laterality Date  . Tracheotomy      as child   History reviewed. No pertinent family history. Social History  Substance Use Topics  . Smoking status: Current Every Day Smoker  . Smokeless tobacco: None  . Alcohol Use: Yes   OB History    No data available     Review of Systems  Constitutional: Negative for fever and chills.  HENT: Negative for congestion, ear pain, rhinorrhea and sore throat.   Eyes: Positive for photophobia, pain, discharge and redness. Negative for itching and visual disturbance.  Respiratory:  Negative for cough and shortness of breath.   Musculoskeletal: Negative for neck stiffness.  Skin: Negative for wound.  Allergic/Immunologic: Negative for immunocompromised state.  Hematological: Does not bruise/bleed easily.  Psychiatric/Behavioral: Negative for self-injury.      Allergies  Review of patient's allergies indicates no known allergies.  Home Medications   Prior to Admission medications   Medication Sig Start Date End Date Taking? Authorizing Provider  clindamycin (CLEOCIN) 150 MG capsule Take 2 capsules (300 mg total) by mouth 3 (three) times daily. May dispense as 150mg  capsules 09/23/12   Roxy Horseman, PA-C  HYDROcodone-acetaminophen (NORCO/VICODIN) 5-325 MG per tablet Take 2 tablets by mouth every 6 (six) hours as needed for pain. 09/23/12   Roxy Horseman, PA-C  ibuprofen (ADVIL,MOTRIN) 200 MG tablet Take 800 mg by mouth every 6 (six) hours as needed for pain.    Historical Provider, MD   BP 156/82 mmHg  Pulse 68  Temp(Src) 98.7 F (37.1 C) (Oral)  Resp 16  SpO2 98% Physical Exam  Constitutional: She appears well-developed and well-nourished. No distress.  HENT:  Head: Normocephalic and atraumatic.  Eyes: EOM and lids are normal. Pupils are equal, round, and reactive to light. Lids are everted and swept, no foreign bodies found. Right eye exhibits no discharge. Left eye exhibits no discharge. No foreign body present in the left eye. Left conjunctiva is injected. Left conjunctiva has no hemorrhage. No scleral icterus.  Slit lamp exam:      The  left eye shows fluorescein uptake. The left eye shows no corneal flare, no corneal ulcer, no foreign body, no hyphema and no hypopyon.  Fluorescein uptake with ill-defined borders over 4-5 o'clock area of left eye.    Neck: Neck supple.  Pulmonary/Chest: Effort normal.  Neurological: She is alert.  Skin: She is not diaphoretic.  Nursing note and vitals reviewed.   ED Course  Procedures  DIAGNOSTIC STUDIES: Oxygen  Saturation is 98% on room air, normal by my interpretation.    COORDINATION OF CARE: 11:04 AM-Discussed treatment plan which includes medications and follow-up with optometrist/ophthalmalogist with patient/guardian at bedside and patient/guardian agreed to plan.    Labs Review Labs Reviewed - No data to display  Imaging Review No results found. I have personally reviewed and evaluated these images and lab results as part of my medical decision-making.   EKG Interpretation None      MDM   Final diagnoses:  Corneal abrasion, left, initial encounter    Afebrile, nontoxic patient with left eye irritation after FB blew in eye while mowing lawn.  No FB currently on exam.  Increased fluorescein uptake c/w mild corneal abrasion.  No other s/s or risk factors known for conjunctivitis.  Doubt iritis, globe injury, acute glaucoma.  D/C home with polytrim, ketorolac ophth solution, optometry follow up.  Discussed result, findings, treatment, and follow up  with patient.  Pt given return precautions.  Pt verbalizes understanding and agrees with plan.       I personally performed the services described in this documentation, which was scribed in my presence. The recorded information has been reviewed and is accurate.    Trixie Dredge, PA-C 12/24/14 1119  Raeford Razor, MD 12/25/14 0900

## 2015-09-11 IMAGING — DX DG KNEE COMPLETE 4+V*R*
4 series · 4 of 4 positions shown · non-contrast
Comparison: None.

CLINICAL DATA: Hit knee on ladder. Knee pain and swelling of right
knee.

EXAM:
RIGHT KNEE - COMPLETE 4+ VIEW

[knee ap]
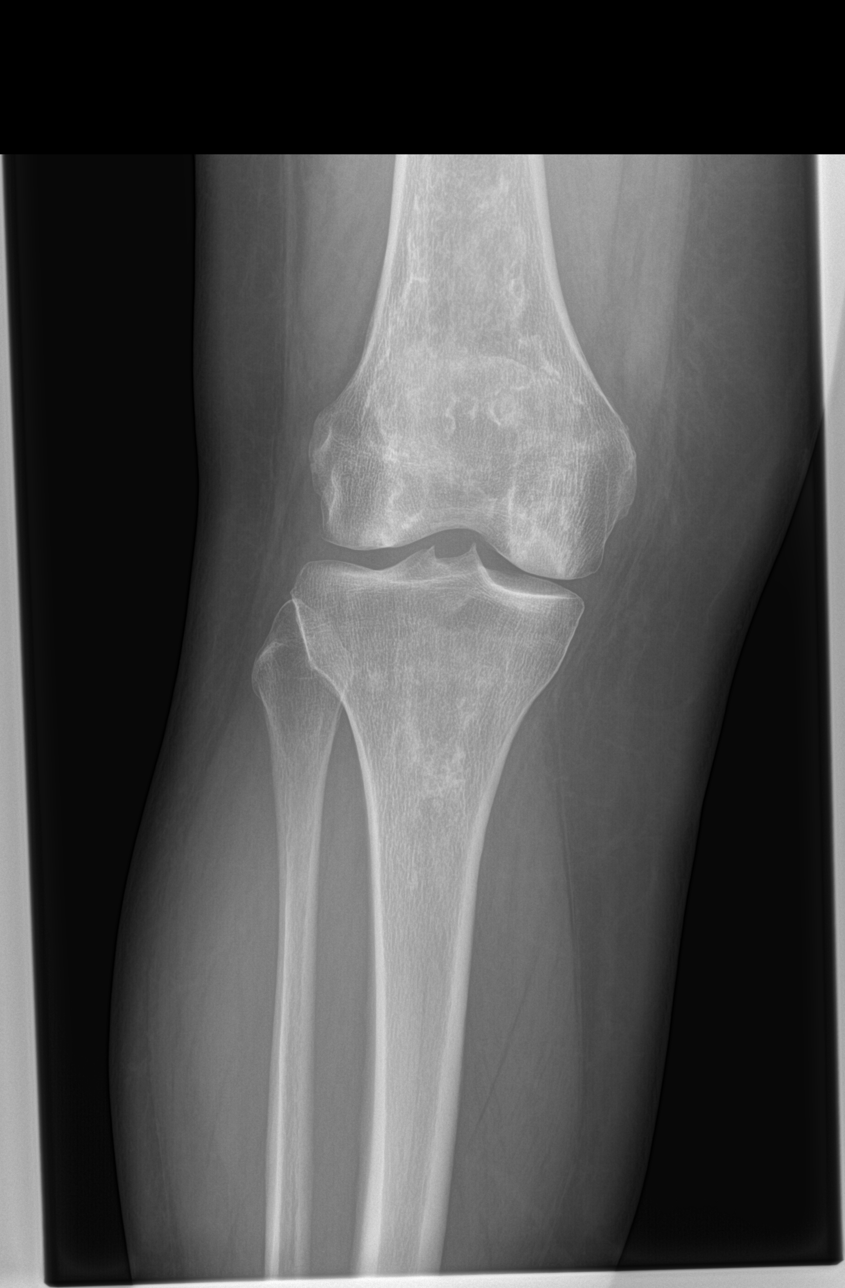

[knee obl (1 of 2)]
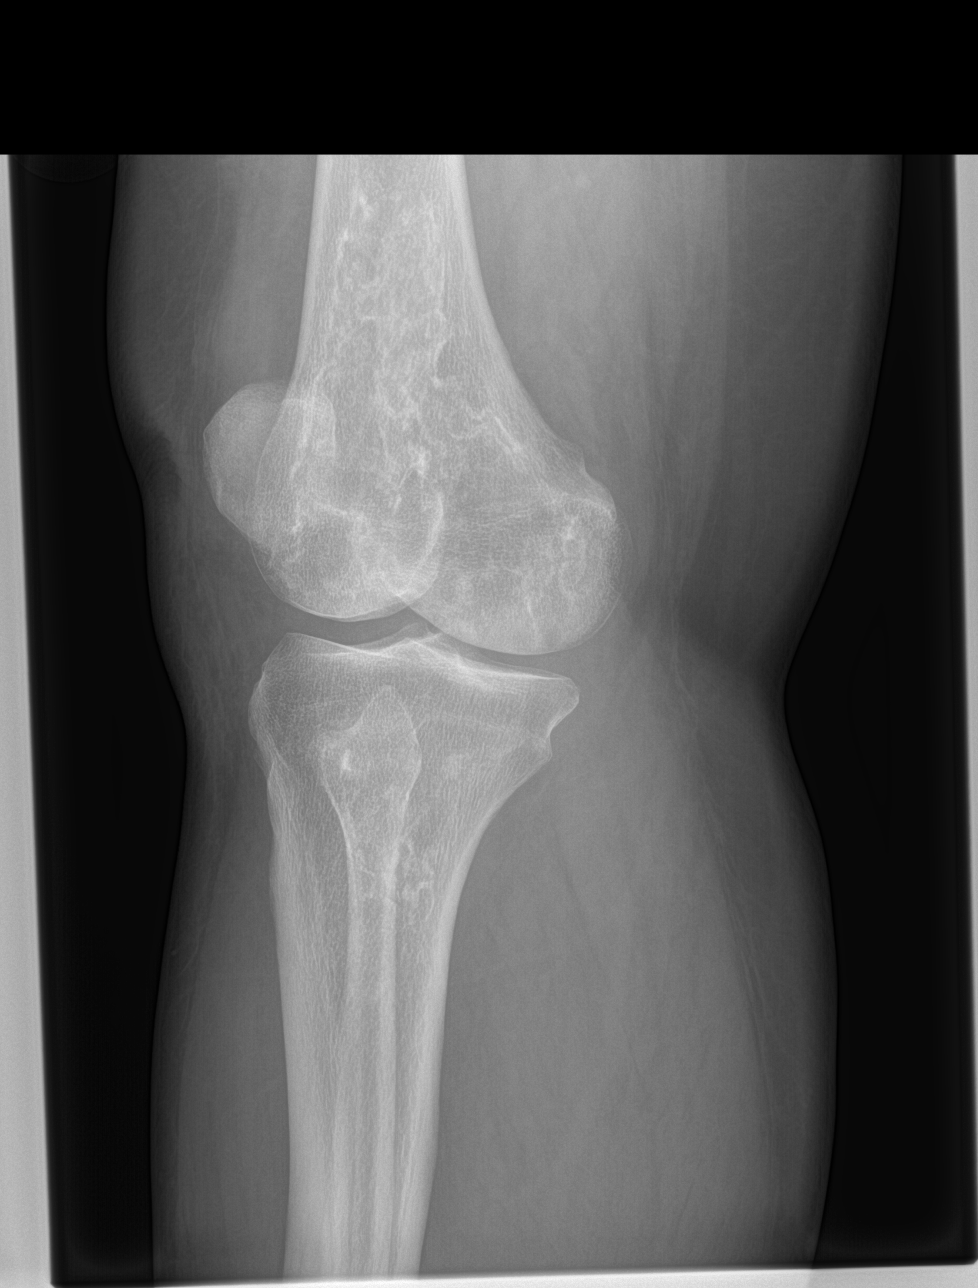

[knee obl (2 of 2)]
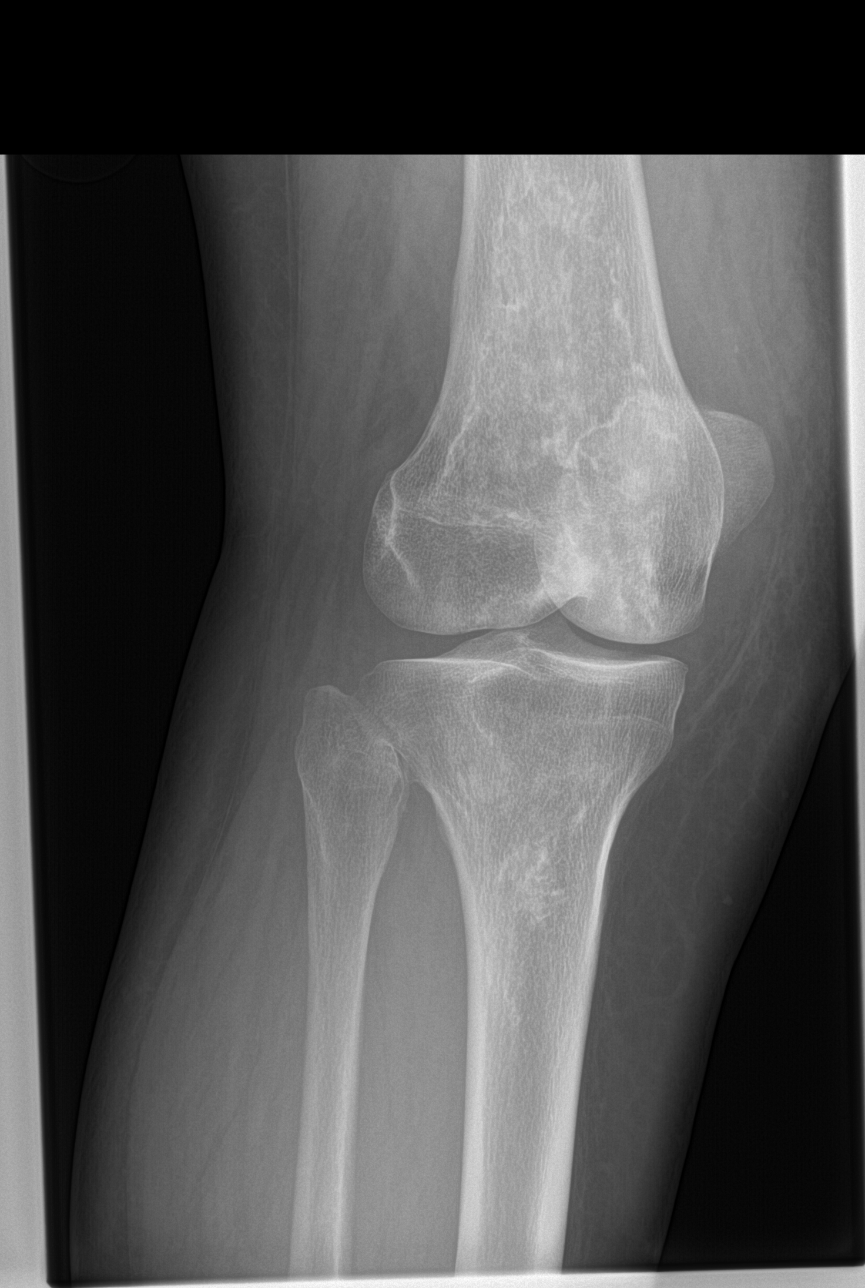

[knee lat]
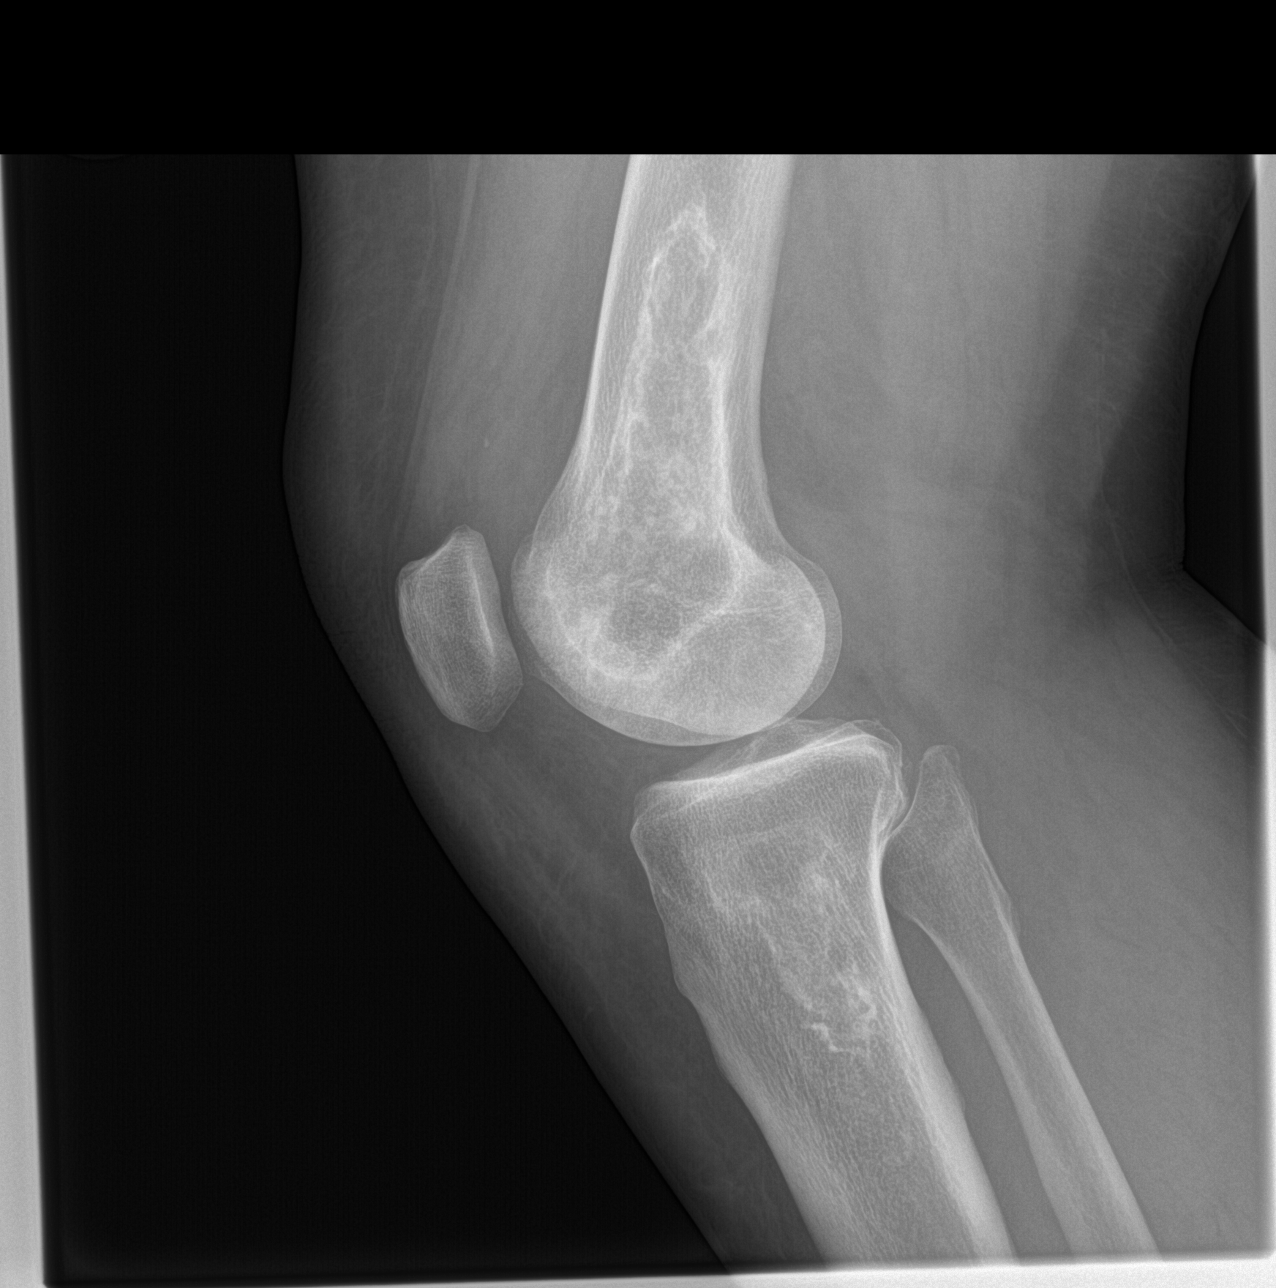

[4 of 4 positions shown; findings below may reference images not displayed]

FINDINGS: There are curvilinear sclerotic areas within the distal femur and
proximal tibia most compatible with bone infarcts. No acute bony
abnormality. No fracture, subluxation or dislocation. Possible trace
joint effusion. Soft tissues are intact.
IMPRESSION: Bone infarct the distal femur and proximal tibia. No acute bony
abnormality.

## 2015-10-11 ENCOUNTER — Encounter (HOSPITAL_COMMUNITY): Payer: Self-pay | Admitting: *Deleted

## 2015-10-11 ENCOUNTER — Ambulatory Visit (HOSPITAL_COMMUNITY)
Admission: EM | Admit: 2015-10-11 | Discharge: 2015-10-11 | Disposition: A | Discharge status: Home / Self Care | Payer: BLUE CROSS/BLUE SHIELD | Attending: Family Medicine | Admitting: Family Medicine

## 2015-10-11 DIAGNOSIS — J069 Acute upper respiratory infection, unspecified: Secondary | ICD-10-CM

## 2015-10-11 MED ORDER — AMOXICILLIN 500 MG PO CAPS: 500.0000 mg | ORAL_CAPSULE | Freq: Three times a day (TID) | ORAL | Status: AC

## 2015-10-11 NOTE — Discharge Instructions (Signed)
Upper Respiratory Infection, Adult °Most upper respiratory infections (URIs) are a viral infection of the air passages leading to the lungs. A URI affects the nose, throat, and upper air passages. The most common type of URI is nasopharyngitis and is typically referred to as "the common cold." °URIs run their course and usually go away on their own. Most of the time, a URI does not require medical attention, but sometimes a bacterial infection in the upper airways can follow a viral infection. This is called a secondary infection. Sinus and middle ear infections are common types of secondary upper respiratory infections. °Bacterial pneumonia can also complicate a URI. A URI can worsen asthma and chronic obstructive pulmonary disease (COPD). Sometimes, these complications can require emergency medical care and may be life threatening.  °CAUSES °Almost all URIs are caused by viruses. A virus is a type of germ and can spread from one person to another.  °RISKS FACTORS °You may be at risk for a URI if:  °· You smoke.   °· You have chronic heart or lung disease. °· You have a weakened defense (immune) system.   °· You are very young or very old.   °· You have nasal allergies or asthma. °· You work in crowded or poorly ventilated areas. °· You work in health care facilities or schools. °SIGNS AND SYMPTOMS  °Symptoms typically develop 2-3 days after you come in contact with a cold virus. Most viral URIs last 7-10 days. However, viral URIs from the influenza virus (flu virus) can last 14-18 days and are typically more severe. Symptoms may include:  °· Runny or stuffy (congested) nose.   °· Sneezing.   °· Cough.   °· Sore throat.   °· Headache.   °· Fatigue.   °· Fever.   °· Loss of appetite.   °· Pain in your forehead, behind your eyes, and over your cheekbones (sinus pain). °· Muscle aches.   °DIAGNOSIS  °Your health care provider may diagnose a URI by: °· Physical exam. °· Tests to check that your symptoms are not due to  another condition such as: °· Strep throat. °· Sinusitis. °· Pneumonia. °· Asthma. °TREATMENT  °A URI goes away on its own with time. It cannot be cured with medicines, but medicines may be prescribed or recommended to relieve symptoms. Medicines may help: °· Reduce your fever. °· Reduce your cough. °· Relieve nasal congestion. °HOME CARE INSTRUCTIONS  °· Take medicines only as directed by your health care provider.   °· Gargle warm saltwater or take cough drops to comfort your throat as directed by your health care provider. °· Use a warm mist humidifier or inhale steam from a shower to increase air moisture. This may make it easier to breathe. °· Drink enough fluid to keep your urine clear or pale yellow.   °· Eat soups and other clear broths and maintain good nutrition.   °· Rest as needed.   °· Return to work when your temperature has returned to normal or as your health care provider advises. You may need to stay home longer to avoid infecting others. You can also use a face mask and careful hand washing to prevent spread of the virus. °· Increase the usage of your inhaler if you have asthma.   °· Do not use any tobacco products, including cigarettes, chewing tobacco, or electronic cigarettes. If you need help quitting, ask your health care provider. °PREVENTION  °The best way to protect yourself from getting a cold is to practice good hygiene.  °· Avoid oral or hand contact with people with cold   symptoms.   Wash your hands often if contact occurs.  There is no clear evidence that vitamin C, vitamin E, echinacea, or exercise reduces the chance of developing a cold. However, it is always recommended to get plenty of rest, exercise, and practice good nutrition.  SEEK MEDICAL CARE IF:   You are getting worse rather than better.   Your symptoms are not controlled by medicine.   You have chills.  You have worsening shortness of breath.  You have brown or red mucus.  You have yellow or brown nasal  discharge.  You have pain in your face, especially when you bend forward.  You have a fever.  You have swollen neck glands.  You have pain while swallowing.  You have white areas in the back of your throat. SEEK IMMEDIATE MEDICAL CARE IF:   You have severe or persistent:  Headache.  Ear pain.  Sinus pain.  Chest pain.  You have chronic lung disease and any of the following:  Wheezing.  Prolonged cough.  Coughing up blood.  A change in your usual mucus.  You have a stiff neck.  You have changes in your:  Vision.  Hearing.  Thinking.  Mood. MAKE SURE YOU:   Understand these instructions.  Will watch your condition.  Will get help right away if you are not doing well or get worse.   This information is not intended to replace advice given to you by your health care provider. Make sure you discuss any questions you have with your health care provider.   Document Released: 10/18/2000 Document Revised: 09/08/2014 Document Reviewed: 07/30/2013 Elsevier Interactive Patient Education 2016 [employer]  Laryngitis Laryngitis is swelling (inflammation) of your vocal cords. This causes hoarseness, coughing, loss of voice, sore throat, or a dry throat. When your vocal cords are inflamed, your voice sounds different. Laryngitis can be temporary (acute) or long-term (chronic). Most cases of acute laryngitis improve with time. Chronic laryngitis is laryngitis that lasts for more than three weeks. HOME CARE  Drink enough fluid to keep your pee (urine) clear or pale yellow.  Breathe in moist air. Use a humidifier if you live in a dry climate.  Take medicines only as told by your doctor.  Do not smoke cigarettes or electronic cigarettes. If you need help quitting, ask your doctor.  Talk as little as possible. Also avoid whispering, which can cause vocal strain.  Write instead of talking. Do this until your voice is back to normal. GET HELP IF:  You have a  fever.  Your pain is worse.  You have trouble swallowing. GET HELP RIGHT AWAY IF:  You cough up blood.  You have trouble breathing.   This information is not intended to replace advice given to you by your health care provider. Make sure you discuss any questions you have with your health care provider.   Document Released: 04/13/2011 Document Revised: 05/15/2014 Document Reviewed: 10/07/2013 Elsevier Interactive Patient Education [employer] Else[employer] Inc.

## 2015-10-11 NOTE — ED Provider Notes (Signed)
CSN: [id number]     Arrival date & time 10/11/15  1308 History   First MD Initiated Contact with Patient 10/11/15 1437     Chief Complaint  Patient presents with  . Cough   (Consider location/radiation/quality/duration/timing/severity/associated sxs/prior Treatment) HPI History obtained from patient:  Pt presents with the cc of:  Upper respiratory Duration of symptoms: 2-3 days Treatment prior to arrival: Symptomatic treatment with over-the-counter medications at home Context: Her grandchildren have been to her house and several of them are ill with upper respiratory symptoms and she now has similar symptoms. Other symptoms include: Voice is hoarse Pain score: 2 FAMILY HISTORY: Irving Burton history of recent URI symptoms.    History reviewed. No pertinent past medical history. Past Surgical History  Procedure Laterality Date  . Tracheotomy      as child   History reviewed. No pertinent family history. Social History  Substance Use Topics  . Smoking status: Current Every Day Smoker  . Smokeless tobacco: None  . Alcohol Use: Yes   OB History    No data available     Review of Systems  Denies: HEADACHE, NAUSEA, ABDOMINAL PAIN, CHEST PAIN, CONGESTION, DYSURIA, SHORTNESS OF BREATH  Allergies  Review of patient's allergies indicates no known allergies.  Home Medications   Prior to Admission medications   Medication Sig Start Date End Date Taking? Authorizing Provider  amoxicillin (AMOXIL) 500 MG capsule Take 1 capsule (500 mg total) by mouth 3 (three) times daily. 10/11/15   Tharon Aquas, PA  clindamycin (CLEOCIN) 150 MG capsule Take 2 capsules (300 mg total) by mouth 3 (three) times daily. May dispense as  capsules 09/23/12   Roxy Horseman, PA-C  HYDROcodone-acetaminophen (NORCO/VICODIN) 5-325 MG per tablet Take 2 tablets by mouth every 6 (six) hours as needed for pain. 09/23/12   Roxy Horseman, PA-C  ibuprofen (ADVIL,MOTRIN) 200 MG tablet Take 800 mg by mouth every 6  (six) hours as needed for pain.    Historical Provider, MD  ketorolac (ACULAR) 0.5 % ophthalmic solution Place 1 drop into the left eye every 6 (six) hours as needed (pain). 12/24/14   Trixie Dredge, PA-C  trimethoprim-polymyxin b (POLYTRIM) ophthalmic solution Place 1 drop into the left eye every 4 (four) hours. X 7 days 12/24/14   Trixie Dredge, PA-C   Meds Ordered and Administered this Visit  Medications - No data to display  BP 162/97 mmHg  Pulse 74  Temp(Src) 98.6 F (37 C) (Oral)  Resp 16  SpO2 97% No data found.   Physical Exam NURSES NOTES AND VITAL SIGNS REVIEWED. CONSTITUTIONAL: Well developed, well nourished, no acute distress HEENT: normocephalic, atraumatic EYES: Conjunctiva normal NECK:normal ROM, supple, no adenopathy PULMONARY:No respiratory distress, normal effort ABDOMINAL: Soft, ND, NT BS+, No CVAT MUSCULOSKELETAL: Normal ROM of all extremities,  SKIN: warm and dry without rash PSYCHIATRIC: Mood and affect, behavior are normal  ED Course  Procedures (including critical care time)  Labs Review Labs Reviewed - No data to display  Imaging Review No results found.   Visual Acuity Review  Right Eye Distance:   Left Eye Distance:   Bilateral Distance:    Right Eye Near:   Left Eye Near:    Bilateral Near:       Expect full recovery from this illness.There are no indications the patient needs emergent treatment at this time no indication that she needs to be transferred to a higher level of care. Patient is stable to be discharged home.  Rx for amoxil,  no testing done.   MDM   1. Acute URI     Patient is reassured that there are no issues that require transfer to higher level of care at this time or additional tests. Patient is advised to continue home symptomatic treatment. Patient is advised that if there are new or worsening symptoms to attend the emergency department, contact primary care provider, or return to UC. Instructions of care provided  discharged home in stable condition.    THIS NOTE WAS GENERATED USING A VOICE RECOGNITION SOFTWARE PROGRAM. ALL REASONABLE EFFORTS  WERE MADE TO PROOFREAD THIS DOCUMENT FOR ACCURACY.  I have verbally reviewed the discharge instructions with the patient. A printed AVS was given to the patient.  All questions were answered prior to discharge.      Tharon AquasFrank C Sadira Standard, PA 10/11/15 1501

## 2015-10-11 NOTE — ED Notes (Signed)
Pt  Reports   Symptoms  Of  Being  Hoarse   With a  Productive   Cough   With  Onset  Of symptoms     X  2  Days    Pt  Sitting  Upright on the  Exam table  Speaking in  Complete   sentances    And  Is  In no  Acute /  Severe  Distress   Missed  Work  Today  -  Pt is  Former  Smoker not  now
